# Patient Record
Sex: Male | Born: 1995 | Race: White | Hispanic: No | Marital: Married | Smoking: Never smoker
Health system: Southern US, Community
[De-identification: ages and names within clinical notes are randomized; demographics above are authoritative.]

## PROBLEM LIST (undated history)

## (undated) HISTORY — PX: TONSILLECTOMY AND ADENOIDECTOMY: SUR1326

## (undated) HISTORY — PX: KNEE ARTHROSCOPY WITH ANTERIOR CRUCIATE LIGAMENT (ACL) REPAIR: SHX5644

---
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
## 2013-02-01 ENCOUNTER — Emergency Department (HOSPITAL_COMMUNITY)
Admission: EM | Admit: 2013-02-01 | Discharge: 2013-02-02 | Disposition: A | Discharge status: Home / Self Care | Payer: Medicaid Other | Attending: Emergency Medicine | Admitting: Emergency Medicine

## 2013-02-01 ENCOUNTER — Encounter (HOSPITAL_COMMUNITY): Payer: Self-pay

## 2013-02-01 DIAGNOSIS — S060X1A Concussion with loss of consciousness of 30 minutes or less, initial encounter: Secondary | ICD-10-CM | POA: Insufficient documentation

## 2013-02-01 DIAGNOSIS — W1809XA Striking against other object with subsequent fall, initial encounter: Secondary | ICD-10-CM | POA: Insufficient documentation

## 2013-02-01 DIAGNOSIS — Y9239 Other specified sports and athletic area as the place of occurrence of the external cause: Secondary | ICD-10-CM | POA: Insufficient documentation

## 2013-02-01 DIAGNOSIS — Y9367 Activity, basketball: Secondary | ICD-10-CM | POA: Insufficient documentation

## 2013-02-01 NOTE — ED Notes (Signed)
Pt either collided with another player during basketball practice or hit the floor, noone witnessed the fall, patient states he doesn't remember anything that happened. Pt is very lethargic and complains of a severe headache.

## 2013-02-01 NOTE — ED Notes (Signed)
Pt also busted his lip when he fell, at the time they noticed blood around his nose but unknown if it came from his nose or lip

## 2013-02-02 ENCOUNTER — Emergency Department (HOSPITAL_COMMUNITY): Payer: Medicaid Other

## 2013-02-02 MED ORDER — HYDROCODONE-ACETAMINOPHEN 5-325 MG PO TABS: 1.0000 | ORAL_TABLET | ORAL | Status: DC | PRN

## 2013-02-02 MED ORDER — ONDANSETRON 8 MG PO TBDP
8.0000 mg | ORAL_TABLET | Freq: Once | ORAL | Status: AC
  Administered 2013-02-02: 8 mg via ORAL
  Filled 2013-02-02: qty 1

## 2013-02-02 MED ORDER — HYDROCODONE-ACETAMINOPHEN 5-325 MG PO TABS
1.0000 | ORAL_TABLET | Freq: Once | ORAL | Status: AC
  Administered 2013-02-02: 1 via ORAL
  Filled 2013-02-02: qty 1

## 2013-02-02 MED ORDER — IBUPROFEN 600 MG PO TABS: 600.0000 mg | ORAL_TABLET | Freq: Three times a day (TID) | ORAL | Status: DC | PRN

## 2013-02-02 NOTE — ED Provider Notes (Signed)
History     CSN: [id number]  Arrival date & time 02/01/13  2329   First MD Initiated Contact with Patient 02/02/13 0000      Chief Complaint  Patient presents with  . Head Injury    HPI Patient reports that time as well this evening and it sounds like he collided with another player.  He is unsure if he hit the floor hit the person's head.  He reports severe headache at this time.  Is not on anticoagulants.  No weakness of his upper lower extremities.  No neck pain.  He does have a small laceration to the buccal mucosa of his upper lip.  No trismus or malocclusion.  No dental injury.  His symptoms are mild to moderate in severity.  Nothing worsens or improves his symptoms.  No vomiting    History reviewed. No pertinent past medical history.  History reviewed. No pertinent past surgical history.  History reviewed. No pertinent family history.  History  Substance Use Topics  . Smoking status: Not on file  . Smokeless tobacco: Not on file  . Alcohol Use: No      Review of Systems  All other systems reviewed and are negative.    Allergies  Review of patient's allergies indicates no known allergies.  Home Medications  No current outpatient prescriptions on file.  BP 137/69  Pulse 84  Temp(Src) 98.4 F (36.9 C) (Oral)  Resp 20  Ht 5\' 10"  (1.778 m)  Wt 170 lb (77.111 kg)  BMI 24.39 kg/m2  SpO2 100%  Physical Exam  Nursing note and vitals reviewed. Constitutional: He is oriented to person, place, and time. He appears well-developed and well-nourished.  HENT:  Head: Normocephalic and atraumatic.  Small punctate laceration of the buccal mucosa of his upper lip on the left.  Dentition is normal.  No trismus or malocclusion.  Eyes: EOM are normal. Pupils are equal, round, and reactive to light.  Neck: Normal range of motion.  Cardiovascular: Normal rate, regular rhythm, normal heart sounds and intact distal pulses.   Pulmonary/Chest: Effort normal and breath sounds  normal. No respiratory distress.  Abdominal: Soft. He exhibits no distension. There is no tenderness.  Musculoskeletal: Normal range of motion.  Neurological: He is alert and oriented to person, place, and time.  5/5 strength in major muscle groups of  bilateral upper and lower extremities. Speech normal. No facial asymetry.   Skin: Skin is warm and dry.  Psychiatric: He has a normal mood and affect. Judgment normal.    ED Course  Procedures (including critical care time)  Labs Reviewed - No data to display Ct Head Wo Contrast  02/02/2013  *RADIOLOGY REPORT*  Clinical Data: Lethargy and severe headache after injury during basketball.  CT HEAD WITHOUT CONTRAST  Technique:  Contiguous axial images were obtained from the base of the skull through the vertex without contrast.  Comparison: None.  Findings: The ventricles and sulci are symmetrical without significant effacement, displacement, or dilatation. No mass effect or midline shift. No abnormal extra-axial fluid collections. The grey-white matter junction is distinct. Basal cisterns are not effaced. No acute intracranial hemorrhage. No depressed skull fractures.  Mild mucosal thickening in the maxillary antra and sphenoid sinus.  Small air-fluid level in the sphenoid sinus. Mastoid air cells are not opacified.  IMPRESSION: No acute intracranial abnormalities.  Probable inflammatory changes in the paranasal sinuses.  No depressed fractures identified.   Original Report Authenticated By: Burman Nieves, M.D.  1. Concussion, with loss of consciousness of 30 minutes or less, initial encounter       MDM  CT head is negative.  Likely concussion.  Patient and family given information regarding second impact syndrome.  Home with pain medication.  Understands return to the ER for new or worsening symptoms        Lyanne Co, MD 02/02/13 0040

## 2014-04-15 ENCOUNTER — Encounter (HOSPITAL_BASED_OUTPATIENT_CLINIC_OR_DEPARTMENT_OTHER): Payer: Self-pay

## 2014-04-15 ENCOUNTER — Emergency Department (HOSPITAL_BASED_OUTPATIENT_CLINIC_OR_DEPARTMENT_OTHER)
Admission: EM | Admit: 2014-04-15 | Discharge: 2014-04-15 | Disposition: A | Discharge status: Home / Self Care | Payer: BC Managed Care – PPO | Attending: Emergency Medicine | Admitting: Emergency Medicine

## 2014-04-15 ENCOUNTER — Emergency Department (HOSPITAL_BASED_OUTPATIENT_CLINIC_OR_DEPARTMENT_OTHER): Payer: BC Managed Care – PPO

## 2014-04-15 DIAGNOSIS — S8391XA Sprain of unspecified site of right knee, initial encounter: Secondary | ICD-10-CM | POA: Diagnosis present

## 2014-04-15 DIAGNOSIS — IMO0002 Reserved for concepts with insufficient information to code with codable children: Secondary | ICD-10-CM | POA: Insufficient documentation

## 2014-04-15 DIAGNOSIS — Y9367 Activity, basketball: Secondary | ICD-10-CM | POA: Insufficient documentation

## 2014-04-15 DIAGNOSIS — Y9239 Other specified sports and athletic area as the place of occurrence of the external cause: Secondary | ICD-10-CM | POA: Insufficient documentation

## 2014-04-15 DIAGNOSIS — Y92838 Other recreation area as the place of occurrence of the external cause: Secondary | ICD-10-CM

## 2014-04-15 DIAGNOSIS — X500XXA Overexertion from strenuous movement or load, initial encounter: Secondary | ICD-10-CM | POA: Insufficient documentation

## 2014-04-15 DIAGNOSIS — Z791 Long term (current) use of non-steroidal anti-inflammatories (NSAID): Secondary | ICD-10-CM | POA: Insufficient documentation

## 2014-04-15 MED ORDER — OXYCODONE-ACETAMINOPHEN 5-325 MG PO TABS
1.0000 | ORAL_TABLET | Freq: Once | ORAL | Status: AC
  Administered 2014-04-15: 1 via ORAL
  Filled 2014-04-15: qty 1

## 2014-04-15 MED ORDER — OXYCODONE-ACETAMINOPHEN 5-325 MG PO TABS: 1.0000 | ORAL_TABLET | Freq: Four times a day (QID) | ORAL | Status: DC | PRN

## 2014-04-15 NOTE — Discharge Instructions (Signed)
Combined Knee Ligament Sprain Combined knee ligament sprain is a tear of more than one of the major ligaments of the knee. The four knee ligaments are the anterior cruciate ligament (ACL), posterior cruciate ligament (PCL), medial collateral ligament (MCL) and lateral collateral ligament (LCL). Ligaments connect bones. They often cross a joint to hold the bones together. The ligaments of the knee keep the thigh bone (femur) and shinbone (tibia) in alignment. These ligaments allow the joint to move within a certain range of motion. Movement outside this range causes a ligament strain. Injury to multiple ligaments at the same time results in difficulty playing sports and in daily living. The most common multiple knee ligament injury involves the ACL and MCL. SYMPTOMS   A "popping" sound heard or felt at the time of injury.  Inability to continue activity after injury.  Inflammation of the knee within 6 hours after injury.  Possibly, deformity of the knee.  Inability to straighten the knee.  Feeling of the knee giving way or buckling.  Sometimes, locking of the knee, if the joint cartilage (meniscus) is injured.  Rarely, numbness, weakness, paralysis, discoloration, or coldness, due to nerve or blood vessel injury. CAUSES  Spraining of multiple ligaments occurs when a force is placed on the ligaments that exceeds their strength. This is often caused by a direct hit (trauma). It may also be caused by a non-contact injury (hyperextending the knee while twisting it).  RISK INCREASES WITH:  Contact sports (football, rugby, lacrosse). Sports that involve pivoting, jumping, cutting, or changing direction (basketball, gymnastics, soccer, volleyball). Sports on uneven ground (cross-country running, soccer).  Poor strength and/or flexibility.  Improper fitted or padded equipment. PREVENTION  Warm up and stretch properly before activity.  Maintain physical fitness:  Thigh, leg, and knee  flexibility.  Muscle strength and endurance.  Learn and use proper exercise technique.  Wear proper and well fitting equipment (correct length of cleats for surface). PROGNOSIS  Without treatment, the knee will continue to give way and become vulnerable to recurring injury. Recurring injury can happen during athletics or daily living. If the injury includes damage to a nerve or artery, the chance of a poor outcome increases. Surgery is often needed to regain stability of the knee. RELATED COMPLICATIONS  Frequently recurring symptoms, including:  Knee giving way.  Joint instability.  Inflammation.  Injury to the joint cartilage (meniscus). This may result in locking and/or swelling of the knee.  Injury to joint (articular) cartilage of the thigh bone or shinbone. This may result in arthritis of the knee.  Injury to other ligaments of the knee.  Knee stiffness (loss of knee motion).  Permanent injury to nerves (numbness, weakness, or paralysis) or arteries.  Removal (amputation) of the leg, due to nerve or artery injury. TREATMENT  Treatment first involves medicine and ice, to reduce pain and inflammation. Crutches may be advised, to decrease pain while walking. The knee may be restrained. Rehabilitation focuses on reducing swelling, regaining range of motion, and regaining muscle control and strength. It may also include receiving proper use training, wearing a brace, and education. (Avoid sports that involve pivoting, cutting, changing direction, jumping and landing). Surgery often offers the best chance for full recovery. Surgery from combined ACL/MCL injury involves replacement (reconstruction) of the ACL. This also allows for MCL healing. Despite surgery, some athletes may never return to their prior level of competition. The ability to return to sports depends on the related injuries and demands of the sport.  MEDICATION  If pain medicine is needed, nonsteroidal  anti-inflammatory medicines (aspirin and ibuprofen), or other minor pain relievers (acetaminophen), are often advised.  Do not take pain medicine for 7 days before surgery.  Stronger pain relievers may be prescribed. Use only as directed and only as much as you need.  Contact your caregiver immediately if any bleeding, stomach upset, or signs of an allergic reaction occur. COLD THERAPY  Cold treatment (icing) should be applied for 10 to 15 minutes every 2 to 3 hours for inflammation and pain, and immediately after activity that aggravates your symptoms. Use ice packs or an ice massage. SEEK MEDICAL CARE IF:   Symptoms get worse or do not improve in 6 weeks, despite treatment.  After injury or surgery, any of the following occur:  Pain, numbness, coldness, or a blue, gray, or dark color occurs in the foot or toenails.  Increased pain, swelling, redness, drainage of fluids, or bleeding in the affected area.  Signs of infection (headache, muscle aches, dizziness, or a general ill feeling with fever).  New, unexplained symptoms develop. (Drugs used in treatment may produce side effects.) Document Released: 10/19/2005 Document Revised: 01/11/2012 Document Reviewed: 01/31/2009 [facility] Patient Information 2014 [place], [place].

## 2014-04-15 NOTE — ED Provider Notes (Signed)
CSN: [id number]     Arrival date & time 04/15/14  2004 History  This chart was scribed for Junius ArgyleForrest S Kj Imbert, MD by Quintella ReichertMatthew Underwood, ED scribe.  This patient was seen in room MH05/MH05 and the patient's care was started at 9:58 PM.  Chief Complaint  Patient presents with  . Knee Injury     Patient is a 18 y.o. male presenting with leg pain. The history is provided by the patient. No language interpreter was used.  Leg Pain Location:  Knee Time since incident:  3 hours Injury: yes   Mechanism of injury comment:  Landed wrong while playing basketball. Knee location:  R knee Pain details:    Pain severity now: moderate to severe.   Onset quality:  Sudden   Duration:  3 hours   Timing:  Constant Chronicity:  New Worsened by:  Flexion Associated symptoms: numbness (numbness of right ankle)   Associated symptoms: no back pain, no fever and no neck pain    HPI Comments: Nathan Mckee is a 18 y.o. male who presents to the Emergency Department complaining of a knee injury sustained two and a half hours ago while attempting a layup playing basketball. Patient states that when he landed, his right knee "buckled back."  He states that he has trouble bending his knee and the the pain worsens with range of motion. He denies ankle pain, but states that his right ankle feels numb.  History reviewed. No pertinent past medical history. History reviewed. No pertinent past surgical history. History reviewed. No pertinent family history. History  Substance Use Topics  . Smoking status: Never Smoker   . Smokeless tobacco: Never Used  . Alcohol Use: No    Review of Systems  Constitutional: Negative for fever and chills.  HENT: Negative for congestion, rhinorrhea and sore throat.   Eyes: Negative for visual disturbance.  Respiratory: Negative for cough and shortness of breath.   Cardiovascular: Negative for chest pain and leg swelling.  Gastrointestinal: Negative for abdominal pain.   Genitourinary: Negative for dysuria.  Musculoskeletal: Positive for arthralgias (right knee pain and swelling). Negative for back pain and neck pain.  Skin: Negative for rash.  Neurological: Negative for headaches.  Hematological: Does not bruise/bleed easily.  Psychiatric/Behavioral: Negative for confusion.      Allergies  Review of patient's allergies indicates no known allergies.  Home Medications   Prior to Admission medications   Medication Sig Start Date End Date Taking? Authorizing Provider  HYDROcodone-acetaminophen (NORCO/VICODIN) 5-325 MG per tablet Take 1 tablet by mouth every 4 (four) hours as needed for pain. 02/02/13   Lyanne CoKevin M Campos, MD  ibuprofen (ADVIL,MOTRIN) 600 MG tablet Take 1 tablet (600 mg total) by mouth every 8 (eight) hours as needed for pain. 02/02/13   Lyanne CoKevin M Campos, MD   BP 137/86  Pulse 63  Temp(Src) 98.2 F (36.8 C) (Oral)  Resp 18  SpO2 99% Physical Exam  Nursing note and vitals reviewed. Constitutional: He is oriented to person, place, and time. He appears well-developed and well-nourished. No distress.  HENT:  Head: Normocephalic and atraumatic.  Mouth/Throat: Oropharynx is clear and moist. No oropharyngeal exudate.  Eyes: Conjunctivae and EOM are normal. Pupils are equal, round, and reactive to light.  Neck: Normal range of motion. Neck supple. No tracheal deviation present.  Cardiovascular: Normal rate, regular rhythm and normal heart sounds.  Exam reveals no gallop and no friction rub.   No murmur heard. Pulmonary/Chest: Effort normal and breath sounds normal. No  respiratory distress. He has no wheezes. He has no rales.  Abdominal: Soft. Bowel sounds are normal. He exhibits no distension and no mass. There is no tenderness. There is no rebound and no guarding.  Musculoskeletal: Normal range of motion.  Mild diffuse swelling of the right knee. Diffuse moderate tenderness to palpation of the knee.  Moderate decreased ROM due to pain of the  right knee.   Normal capillary refill in distal lower extremities.  2+ distal pulses in distal lower extremities.  Neurological: He is alert and oriented to person, place, and time.  Skin: Skin is warm and dry.  Psychiatric: He has a normal mood and affect. His behavior is normal.    ED Course  Procedures (including critical care time) DIAGNOSTIC STUDIES: Oxygen Saturation is 99% on room air, normal by my interpretation.    COORDINATION OF CARE: 10:10 PM-Informed patient that x-ray was negative for fracture. Discussed treatment plan which includes Percocet, RICE treatment, and referral to follow up with sports medicine if needed with pt at bedside and pt agreed to plan.     Labs Review Labs Reviewed - No data to display  Imaging Review Dg Knee Complete 4 Views Right  04/15/2014   CLINICAL DATA:  Status post fall.  Right knee pain.  EXAM: RIGHT KNEE - COMPLETE 4+ VIEW  COMPARISON:  None.  FINDINGS: There is no evidence of fracture or dislocation. The joint spaces are preserved. No significant degenerative change is seen; the patellofemoral joint is grossly unremarkable in appearance.  Trace joint fluid remains within normal limits. The visualized soft tissues are normal in appearance.  IMPRESSION: No evidence of fracture or dislocation.   Electronically Signed   By: Roanna RaiderJeffery  Chang M.D.   On: 04/15/2014 21:19     EKG Interpretation None      MDM   Final diagnoses:  Right knee sprain    3:23 PM 18 y.o. male here w/ knee after injuring it performing a lay up in basketball. Denies hitting head or loc. Mild effusion w/ dec rom d/t pain. Plain film neg. Pt will not tolerate a more detailed knee exam d/t pain. Will give percocet, knee immobilizer, crutches, recommend RICE.     I have discussed the diagnosis/risks/treatment options with the patient and family and believe the pt to be eligible for discharge home to follow-up with sports medicine in 1 week if no better. We also  discussed returning to the ED immediately if new or worsening sx occur. We discussed the sx which are most concerning (e.g., worsening pain) that necessitate immediate return. Medications administered to the patient during their visit and any new prescriptions provided to the patient are listed below.  Medications given during this visit Medications  oxyCODONE-acetaminophen (PERCOCET/ROXICET) 5-325 MG per tablet 1 tablet (1 tablet Oral Given 04/15/14 2216)    Discharge Medication List as of 04/15/2014 10:11 PM    START taking these medications   Details  oxyCODONE-acetaminophen (PERCOCET) 5-325 MG per tablet Take 1 tablet by mouth every 6 (six) hours as needed., Starting 04/15/2014, Until Discontinued, Print          I personally performed the services described in this documentation, which was scribed in my presence. The recorded information has been reviewed and is accurate.    Junius ArgyleForrest S Micajah Dennin, MD 04/16/14 [phone]

## 2014-04-15 NOTE — ED Notes (Signed)
Pt reports having knee hyper extended.  Sts severe pain while playing basketball.  Pt can't have anything touch knee without pain.

## 2014-04-15 NOTE — ED Notes (Signed)
MD at bedside. 

## 2014-04-15 NOTE — ED Notes (Signed)
Pt reports knee pain while playing slipped on water on floor causing knee pain

## 2014-05-11 ENCOUNTER — Ambulatory Visit: Payer: BC Managed Care – PPO | Admitting: Family Medicine

## 2014-05-30 ENCOUNTER — Encounter: Payer: Self-pay | Admitting: Family Medicine

## 2014-05-30 ENCOUNTER — Ambulatory Visit (INDEPENDENT_AMBULATORY_CARE_PROVIDER_SITE_OTHER): Payer: BC Managed Care – PPO | Admitting: Family Medicine

## 2014-05-30 VITALS — BP 143/81 | HR 62 | Ht 71.0 in | Wt 200.0 lb

## 2014-05-30 DIAGNOSIS — S99919A Unspecified injury of unspecified ankle, initial encounter: Secondary | ICD-10-CM

## 2014-05-30 DIAGNOSIS — S83511A Sprain of anterior cruciate ligament of right knee, initial encounter: Secondary | ICD-10-CM

## 2014-05-30 DIAGNOSIS — S8990XA Unspecified injury of unspecified lower leg, initial encounter: Secondary | ICD-10-CM

## 2014-05-30 DIAGNOSIS — S83509A Sprain of unspecified cruciate ligament of unspecified knee, initial encounter: Secondary | ICD-10-CM

## 2014-05-30 DIAGNOSIS — S8991XA Unspecified injury of right lower leg, initial encounter: Secondary | ICD-10-CM

## 2014-05-30 DIAGNOSIS — S99929A Unspecified injury of unspecified foot, initial encounter: Secondary | ICD-10-CM

## 2014-05-31 ENCOUNTER — Encounter: Payer: Self-pay | Admitting: Family Medicine

## 2014-05-31 DIAGNOSIS — S8991XA Unspecified injury of right lower leg, initial encounter: Secondary | ICD-10-CM | POA: Insufficient documentation

## 2014-05-31 NOTE — Assessment & Plan Note (Signed)
concerning for ACL tear, contusions of knee.  Radiographs negative.  Obvious effusion.  Will go ahead with MRI to further assess and likely orthopedic surgeon referral if this is confirmed.  In meantime icing, nsaids, elevation, avoid cutting sports.

## 2014-05-31 NOTE — Progress Notes (Addendum)
Patient ID: Nathan ConceptionMichael Mckee, male   DOB: 1996/09/30, 18 y.o.   MRN: [id number]  PCP: No PCP Per Patient  Subjective:   HPI: Patient is a 18 y.o. male here for right knee injury.  Patient reports 6 weeks ago he was playing basketball when he came down from a layup and hyperextended his right knee. + swelling and bruising. Feels like it pops out of place now. Sometimes pain is severe. Feels unstable. Radiographs negative in ED. No prior issues with this knee. No catching or locking.  History reviewed. No pertinent past medical history.  Current Outpatient Prescriptions on File Prior to Visit  Medication Sig Dispense Refill  . oxyCODONE-acetaminophen (PERCOCET) 5-325 MG per tablet Take 1 tablet by mouth every 6 (six) hours as needed.  15 tablet  0   No current facility-administered medications on file prior to visit.    Past Surgical History  Procedure Laterality Date  . Tonsillectomy and adenoidectomy      No Known Allergies  History   Social History  . Marital Status: Single    Spouse Name: N/A    Number of Children: N/A  . Years of Education: N/A   Occupational History  . Not on file.   Social History Main Topics  . Smoking status: Never Smoker   . Smokeless tobacco: Never Used  . Alcohol Use: No  . Drug Use: No  . Sexual Activity: Not on file   Other Topics Concern  . Not on file   Social History Narrative  . No narrative on file    No family history on file.  BP 143/81  Pulse 62  Ht 5\' 11"  (1.803 m)  Wt 200 lb (90.719 kg)  BMI 27.91 kg/m2  Review of Systems: See HPI above.    Objective:  Physical Exam:  Gen: NAD  Right knee: Mod effusion.  No bruising, other deformity. TTP medial and lateral joint lines mildly. FROM. 1+ ant drawer and lachmanns compared to left knee.  Negative post drawer. Negative valgus/varus testing.  Negative mcmurrays, apleys, patellar apprehension. NV intact distally.    Assessment & Plan:  1. Right knee  injury - concerning for ACL tear, contusions of knee.  Radiographs negative.  Obvious effusion.  Will go ahead with MRI to further assess and likely orthopedic surgeon referral if this is confirmed.  In meantime icing, nsaids, elevation, avoid cutting sports.  Addendum:  MRI reviewed and discussed with patient.  He does have complete ACL tear but also with medial and lateral meniscus tears.  While it shows MCL sprain, clinically no pain or laxity with testing of MCL.  Over 6 weeks out from the initial injury - already beyond the 4-6 weeks of crutches, non to minimal weight bearing used to treat the femoral condyle impaction fracture.  Will refer to orthopedics to discuss ACL reconstruction, meniscal debridement.

## 2014-06-02 ENCOUNTER — Ambulatory Visit (HOSPITAL_BASED_OUTPATIENT_CLINIC_OR_DEPARTMENT_OTHER)
Admission: RE | Admit: 2014-06-02 | Discharge: 2014-06-02 | Disposition: A | Discharge status: Home / Self Care | Payer: BC Managed Care – PPO | Source: Ambulatory Visit | Attending: Family Medicine | Admitting: Family Medicine

## 2014-06-02 DIAGNOSIS — Y999 Unspecified external cause status: Secondary | ICD-10-CM | POA: Insufficient documentation

## 2014-06-02 DIAGNOSIS — S83511A Sprain of anterior cruciate ligament of right knee, initial encounter: Secondary | ICD-10-CM

## 2014-06-02 DIAGNOSIS — Y929 Unspecified place or not applicable: Secondary | ICD-10-CM | POA: Insufficient documentation

## 2014-06-02 DIAGNOSIS — X58XXXA Exposure to other specified factors, initial encounter: Secondary | ICD-10-CM | POA: Insufficient documentation

## 2014-06-02 DIAGNOSIS — S83509A Sprain of unspecified cruciate ligament of unspecified knee, initial encounter: Secondary | ICD-10-CM | POA: Insufficient documentation

## 2014-06-04 ENCOUNTER — Other Ambulatory Visit: Payer: Self-pay | Admitting: *Deleted

## 2014-06-04 DIAGNOSIS — S83511A Sprain of anterior cruciate ligament of right knee, initial encounter: Secondary | ICD-10-CM

## 2016-05-12 ENCOUNTER — Ambulatory Visit: Payer: Self-pay | Admitting: Family Medicine

## 2016-05-26 ENCOUNTER — Ambulatory Visit: Payer: Self-pay | Admitting: Family Medicine

## 2016-06-02 ENCOUNTER — Ambulatory Visit: Payer: Self-pay | Admitting: Family Medicine

## 2019-10-19 ENCOUNTER — Telehealth: Payer: Self-pay

## 2019-10-19 NOTE — Telephone Encounter (Signed)
Nathan Mckee from [facility] called to get additional phone number for pt. Gave number on file.  [place]

## 2019-11-27 ENCOUNTER — Other Ambulatory Visit: Payer: Self-pay | Admitting: Nurse Practitioner

## 2019-11-27 ENCOUNTER — Ambulatory Visit
Admission: RE | Admit: 2019-11-27 | Discharge: 2019-11-27 | Disposition: A | Discharge status: Home / Self Care | Payer: No Typology Code available for payment source | Source: Ambulatory Visit | Attending: Nurse Practitioner | Admitting: Nurse Practitioner

## 2019-11-27 DIAGNOSIS — Z021 Encounter for pre-employment examination: Secondary | ICD-10-CM

## 2020-09-20 ENCOUNTER — Other Ambulatory Visit: Payer: Self-pay

## 2020-09-20 ENCOUNTER — Emergency Department (HOSPITAL_COMMUNITY): Payer: 59

## 2020-09-20 ENCOUNTER — Encounter (HOSPITAL_COMMUNITY): Payer: Self-pay

## 2020-09-20 ENCOUNTER — Emergency Department (HOSPITAL_COMMUNITY)
Admission: EM | Admit: 2020-09-20 | Discharge: 2020-09-20 | Disposition: A | Discharge status: Home / Self Care | Payer: 59 | Attending: Emergency Medicine | Admitting: Emergency Medicine

## 2020-09-20 DIAGNOSIS — K659 Peritonitis, unspecified: Secondary | ICD-10-CM | POA: Insufficient documentation

## 2020-09-20 DIAGNOSIS — R1032 Left lower quadrant pain: Secondary | ICD-10-CM | POA: Diagnosis present

## 2020-09-20 DIAGNOSIS — K6389 Other specified diseases of intestine: Secondary | ICD-10-CM

## 2020-09-20 LAB — CBC
HCT: 46.3 % (ref 39.0–52.0)
Hemoglobin: 16.1 g/dL (ref 13.0–17.0)
MCH: 31.3 pg (ref 26.0–34.0)
MCHC: 34.8 g/dL (ref 30.0–36.0)
MCV: 90.1 fL (ref 80.0–100.0)
Platelets: 287 10*3/uL (ref 150–400)
RBC: 5.14 MIL/uL (ref 4.22–5.81)
RDW: 11.7 % (ref 11.5–15.5)
WBC: 9.1 10*3/uL (ref 4.0–10.5)
nRBC: 0 % (ref 0.0–0.2)

## 2020-09-20 LAB — URINALYSIS, ROUTINE W REFLEX MICROSCOPIC
Bacteria, UA: NONE SEEN
Bilirubin Urine: NEGATIVE
Glucose, UA: NEGATIVE mg/dL
Hgb urine dipstick: NEGATIVE
Ketones, ur: NEGATIVE mg/dL
Nitrite: NEGATIVE
Protein, ur: NEGATIVE mg/dL
Specific Gravity, Urine: 1.018 (ref 1.005–1.030)
pH: 7 (ref 5.0–8.0)

## 2020-09-20 LAB — COMPREHENSIVE METABOLIC PANEL
ALT: 113 U/L — ABNORMAL HIGH (ref 0–44)
AST: 82 U/L — ABNORMAL HIGH (ref 15–41)
Albumin: 4.5 g/dL (ref 3.5–5.0)
Alkaline Phosphatase: 63 U/L (ref 38–126)
Anion gap: 10 (ref 5–15)
BUN: 10 mg/dL (ref 6–20)
CO2: 24 mmol/L (ref 22–32)
Calcium: 9.3 mg/dL (ref 8.9–10.3)
Chloride: 108 mmol/L (ref 98–111)
Creatinine, Ser: 0.79 mg/dL (ref 0.61–1.24)
GFR, Estimated: >60 mL/min (ref >60)
Glucose, Bld: 89 mg/dL (ref 70–99)
Potassium: 3.8 mmol/L (ref 3.5–5.1)
Sodium: 142 mmol/L (ref 135–145)
Total Bilirubin: 0.8 mg/dL (ref 0.3–1.2)
Total Protein: 7.3 g/dL (ref 6.5–8.1)

## 2020-09-20 LAB — LIPASE, BLOOD: Lipase: 35 U/L (ref 11–51)

## 2020-09-20 MED ORDER — SODIUM CHLORIDE (PF) 0.9 % IJ SOLN
INTRAMUSCULAR | Status: AC
  Filled 2020-09-20: qty 50

## 2020-09-20 MED ORDER — IOHEXOL 300 MG/ML  SOLN
100.0000 mL | Freq: Once | INTRAMUSCULAR | Status: AC | PRN
  Administered 2020-09-20: 100 mL via INTRAVENOUS

## 2020-09-20 MED ORDER — KETOROLAC TROMETHAMINE 30 MG/ML IJ SOLN
30.0000 mg | Freq: Once | INTRAMUSCULAR | Status: AC
  Administered 2020-09-20: 30 mg via INTRAVENOUS
  Filled 2020-09-20: qty 1

## 2020-09-20 NOTE — ED Triage Notes (Signed)
patient c/o intermittent left lower abdominal pain x 1 week. Patient denies any N/v/D.

## 2020-09-20 NOTE — Discharge Instructions (Addendum)
Please treat your symptoms with over-the-counter medications as needed. This should resolve on it's own. Return to the ED if you develop fever, severely worsening abdominal pain, significant diarrhea.

## 2020-09-20 NOTE — ED Provider Notes (Signed)
[facility]-EMERGENCY DEPT Provider Note   CSN: [id number] Arrival date & time: 09/20/20  1452     History Chief Complaint  Patient presents with  . Abdominal Pain    Nathan Mckee is a 24 y.o. male presenting to the emergency department with 1 week of intermittent left lower abdominal pain that is worsening today.  Patient states pain is coming and going, sometimes worse with movement and certain positions.  Today his pain became so severe he could not sit upright.  He has never had similar symptoms in the past.  He has no other accompanying symptoms including no diarrhea, constipation, nausea, vomiting, fevers, urinary symptoms, testicular pain.  No alleviating factors.  No history of abdominal surgeries.  The history is provided by the patient.       History reviewed. No pertinent past medical history.  Patient Active Problem List   Diagnosis Date Noted  . Right knee injury 05/31/2014  . Right knee sprain 04/15/2014    Past Surgical History:  Procedure Laterality Date  . KNEE ARTHROSCOPY WITH ANTERIOR CRUCIATE LIGAMENT (ACL) REPAIR Right   . TONSILLECTOMY AND ADENOIDECTOMY         Family History  Problem Relation Age of Onset  . Hypertension Father     Social History   Tobacco Use  . Smoking status: Never Smoker  . Smokeless tobacco: Never Used  Vaping Use  . Vaping Use: Every day  . Substances: Nicotine, Flavoring  Substance Use Topics  . Alcohol use: No  . Drug use: No    Home Medications Prior to Admission medications   Medication Sig Start Date End Date Taking? Authorizing Provider  oxyCODONE-acetaminophen (PERCOCET) 5-325 MG per tablet Take 1 tablet by mouth every 6 (six) hours as needed. 04/15/14   Purvis Sheffield, MD    Allergies    Patient has no known allergies.  Review of Systems   Review of Systems  Constitutional: Negative for fever.  Gastrointestinal: Positive for abdominal pain. Negative for constipation,  diarrhea, nausea and vomiting.  Genitourinary: Negative for dysuria, flank pain, frequency, hematuria and testicular pain.  All other systems reviewed and are negative.   Physical Exam Updated Vital Signs BP 137/89   Pulse 67   Temp 97.9 F (36.6 C) (Oral)   Resp 16   Ht 5\' 11"  (1.803 m)   Wt 88.5 kg   SpO2 100%   BMI 27.20 kg/m   Physical Exam Vitals and nursing note reviewed.  Constitutional:      General: He is not in acute distress.    Appearance: He is well-developed. He is not ill-appearing.  HENT:     Head: Normocephalic and atraumatic.  Eyes:     Conjunctiva/sclera: Conjunctivae normal.  Cardiovascular:     Rate and Rhythm: Normal rate and regular rhythm.  Pulmonary:     Effort: Pulmonary effort is normal. No respiratory distress.     Breath sounds: Normal breath sounds.  Abdominal:     General: Abdomen is flat. Bowel sounds are normal.     Palpations: Abdomen is soft.     Tenderness: There is abdominal tenderness in the left lower quadrant. There is no right CVA tenderness, left CVA tenderness or guarding.     Hernia: No hernia is present.  Skin:    General: Skin is warm.  Neurological:     Mental Status: He is alert.  Psychiatric:        Behavior: Behavior normal.     ED Results /  Procedures / Treatments   Labs (all labs ordered are listed, but only abnormal results are displayed) Labs Reviewed  COMPREHENSIVE METABOLIC PANEL - Abnormal; Notable for the following components:      Result Value   AST 82 (*)    ALT 113 (*)    All other components within normal limits  URINALYSIS, ROUTINE W REFLEX MICROSCOPIC - Abnormal; Notable for the following components:   Leukocytes,Ua TRACE (*)    All other components within normal limits  LIPASE, BLOOD  CBC    EKG None  Radiology CT Abdomen Pelvis W Contrast  Result Date: 09/20/2020 CLINICAL DATA:  Intermittent left lower abdominal pain for 1 week EXAM: CT ABDOMEN AND PELVIS WITH CONTRAST TECHNIQUE:  Multidetector CT imaging of the abdomen and pelvis was performed using the standard protocol following bolus administration of intravenous contrast. CONTRAST:  OMNIPAQUE IOHEXOL 300 MG/ML  SOLN COMPARISON:  None. FINDINGS: Lower chest: Lung bases are clear. Normal heart size. No pericardial effusion. Hepatobiliary: No worrisome focal liver lesions. Smooth liver surface contour. Normal hepatic attenuation. Normal gallbladder and biliary tree. Pancreas: No pancreatic ductal dilatation or surrounding inflammatory changes. Spleen: Normal in size. No concerning splenic lesions. Adrenals/Urinary Tract: Normal adrenal glands. Kidneys are normally located with symmetric enhancement. No suspicious renal lesion, urolithiasis or hydronephrosis. Urinary bladder is largely decompressed at the time of exam and therefore poorly evaluated by CT imaging. No gross bladder abnormality. Stomach/Bowel: Distal esophagus, stomach and duodenal sweep are unremarkable. No small bowel wall thickening or dilatation. No proximal colonic thickening or dilatation. Minimal thickening of the descending colon with some adjacent surrounding phlegmonous change which appears centered upon a circumscribed elongated focus of fat, likely epiploic appendage with some adjacent reactive thickening of the peritoneum and trace fluid in the left pericolic gutter. More distal colon is unremarkable. Vascular/Lymphatic: No significant vascular findings are present. No enlarged abdominal or pelvic lymph nodes. Reproductive: The prostate and seminal vesicles are unremarkable. Other: Inflammatory changes centered in the left abdomen adjacent the descending colon possibly upon an inflamed epiploic appendage, as above. Trace reactive free fluid in the left pericolic gutter and deep pelvis. No free air. No bowel containing hernia. Musculoskeletal: Benign bone island in the left ischial. No concerning osseous lesions. Musculature is normal and symmetric.  IMPRESSION: Inflammation and phlegmonous centered upon a circumscribed elongated focus of fat, likely epiploic appendage of the descending colon with some adjacent likely reactive thickening of the descending colon, mild peritoneal thickening, and trace fluid in the left pericolic gutter. Findings are favored to reflect an epiploic appendagitis rather than primary colonic process. Electronically Signed   By: Kreg Shropshire M.D.   On: 09/20/2020 19:26    Procedures Procedures (including critical care time)  Medications Ordered in ED Medications  iohexol (OMNIPAQUE) 300 MG/ML solution 100 mL (100 mLs Intravenous Contrast Given 09/20/20 1839)  sodium chloride (PF) 0.9 % injection (  Given 09/20/20 1900)  ketorolac (TORADOL) 30 MG/ML injection 30 mg (30 mg Intravenous Given 09/20/20 2011)    ED Course  I have reviewed the triage vital signs and the nursing notes.  Pertinent labs & imaging results that were available during my care of the patient were reviewed by me and considered in my medical decision making (see chart for details).    MDM Rules/Calculators/A&P                          Patient presenting for 1 week of intermittent left  lower quadrant abdominal pain, worsening today.  No fever, nausea, vomiting, diarrhea or constipation.  No urinary symptoms.  On exam, he is well-appearing and in no distress.  He does have focal tenderness to the left lower quadrant.  Labs with white count of 9.1, normal lipase, metabolic panel with minimally elevated LFTs, UA is negative.  Considering patient's focal abdominal pain and tenderness on exam, with worsening symptoms today, CT scan ordered.    CT scan obtained and is consistent with epiploic appendagitis.  Less likely colitis, no other symptoms to suggest this as well. Discussed diagnosis with patient and plan for supportive measures including NSAIDs.  Discussed reasons to return to the ED which include severely worsening pain, high fever, profuse  diarrhea.  Outpatient follow-up.  Patient is in no distress, appropriate for discharge.  Discussed results, findings, treatment and follow up. Patient advised of return precautions. Patient verbalized understanding and agreed with plan.  Final Clinical Impression(s) / ED Diagnoses Final diagnoses:  Epiploic appendagitis    Rx / DC Orders ED Discharge Orders    None       Jenesa Foresta, [place] N, PA-C 09/20/20 2046    Derwood Kaplan, MD 09/21/20 1349

## 2021-05-23 IMAGING — CR DG CHEST 1V
1 series · 1 of 1 positions shown · non-contrast
Comparison: None.

CLINICAL DATA: Pre-employment physical examination

EXAM:
CHEST  1 VIEW

[w chest pa]
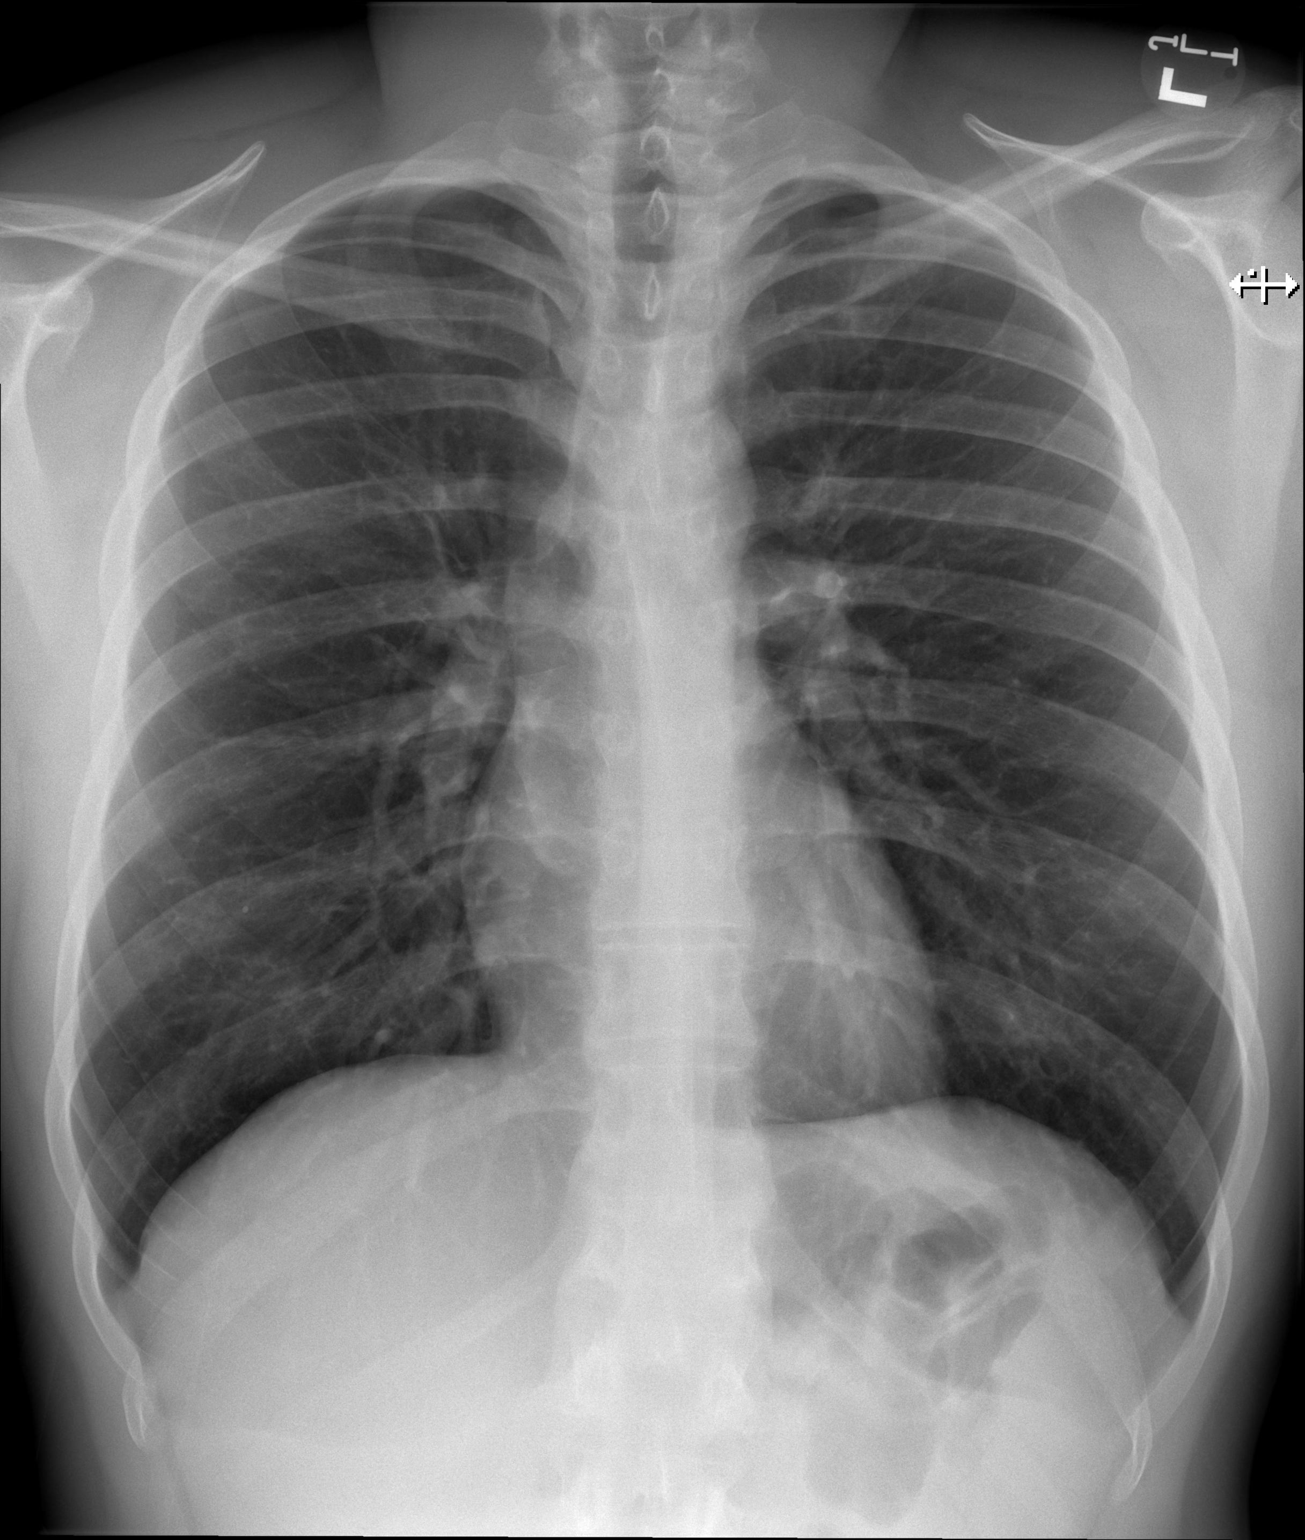

[1 of 1 positions shown; findings below may reference images not displayed]

FINDINGS: Lungs are clear. Heart size and pulmonary vascularity are normal. No
adenopathy. No bone lesions.
IMPRESSION: No abnormality noted.

## 2022-03-09 NOTE — Progress Notes (Addendum)
?Cardiology Office Note:   ? ?Date:  03/10/2022  ? ?ID:  Nathan Mckee, DOB 04-Jun-1996, MRN [id number] ? ?PCP:  Corrington, Meredith Mody, MD  ? ?[facility] Providers ?Cardiologist:  Alverda Skeans, MD ?Referring MD: Vivien Presto, MD  ? ?Chief Complaint/Reason for Referral: Palpitations and chest pain ? ?ASSESSMENT:   ? ?1. Palpitations   ?2. Precordial pain   ? ? ?PLAN:   ? ?In order of problems listed above: ? Palpitations:  Will obtain echocardiogram, monitor, and reflex TSH.  Will keep follow up with me open-ended depending on these results. ?Chest pain: We will obtain an exercise treadmill stress test to evaluate further.  If his evaluation is negative then I advised him to obtain a PPI from his primary care provider to see if that helps. ? ? ?     ? ?Shared Decision Making/Informed Consent ?The risks [chest pain, shortness of breath, cardiac arrhythmias, dizziness, blood pressure fluctuations, myocardial infarction, stroke/transient ischemic attack, and life-threatening complications (estimated to be 1 in 10,000)], benefits (risk stratification, diagnosing coronary artery disease, treatment guidance) and alternatives of an exercise tolerance test were discussed in detail with [name] and he agrees to proceed.  ? ?Dispo:  Return if symptoms worsen or fail to improve.  ? ?  ? ?Medication Adjustments/Labs and Tests Ordered: ?Current medicines are reviewed at length with the patient today.  Concerns regarding medicines are outlined above. ? ?The following changes have been made:  no change  ? ?Labs/tests ordered: ?Orders Placed This Encounter  ?Procedures  ? TSH Rfx on Abnormal to Free T4  ? LONG TERM MONITOR (3-14 DAYS)  ? Exercise Tolerance Test  ? EKG 12-Lead  ? ECHOCARDIOGRAM COMPLETE  ? ? ?Medication Changes: ?No orders of the defined types were placed in this encounter. ? ? ? ?Current medicines are reviewed at length with the patient today.  The patient does not have concerns regarding  medicines. ? ? ?History of Present Illness:   ? ?FOCUSED PROBLEM LIST:   ? Currently vaping ? ?The patient is a 26 y.o. male with the indicated medical history here for palpitations.  The patient is a full-time Emergency planning/management officer.  He tells me he notices palpitations on a daily basis and they happen more than once.  He will notice that his heart seems to skip a beat and then stop.  His palpitations are relatively short-lived but again happen frequently.  They are bothersome and not associated with chest pain but are associated with some lightheadedness and shortness of breath.  He also gets feeling in his neck palpitations and he sees pulsations in his neck as well.  These are separate from the chest palpitations that he describes.  These happen less frequently and can last much longer.  In terms of other symptoms he denies any exertional dyspnea, presyncope, syncope, severe bleeding or bruising, or peripheral edema.  He does admit to a burning chest pain at times that is not related to exertion. ?   ?  ?Previous Medical History: ?History reviewed. No pertinent past medical history. ? ? ?Current Medications: ?No outpatient medications have been marked as taking for the 03/10/22 encounter (Office Visit) with [name], MD.  ?  ? ?Allergies:    ?Patient has no known allergies.  ? ?Social History:   ?Social History  ? ?Tobacco Use  ? Smoking status: Never  ? Smokeless tobacco: Never  ?Vaping Use  ? Vaping Use: Every day  ? Substances: Nicotine, Flavoring  ?Substance Use  Topics  ? Alcohol use: No  ? Drug use: No  ?  ? ?Family Hx: ?Family History  ?Problem Relation Age of Onset  ? Hypertension Father   ?  ? ?Review of Systems:   ?Please see the history of present illness.    ?All other systems reviewed and are negative. ?  ? ? ?EKGs/Labs/Other Test Reviewed:   ? ?EKG:  EKG performed today that I personally reviewed demonstrates normal sinus rhythm with T wave inversions inferiorly. ? ?Prior CV studies: ? ?None  available ? ?Other studies Reviewed: ?Review of the additional studies/records demonstrates: CT abdomen pelvis 2021 with no evidence of aortic atherosclerosis or aneurysm ? ?Recent Labs: ?No results found for requested labs within last 8760 hours.  ? ?Recent Lipid Panel ?No results found for: CHOL, TRIG, HDL, LDLCALC, LDLDIRECT ? ?Risk Assessment/Calculations:   ? ?  ?    ? ?Physical Exam:   ? ?VS:  BP (!) 120/92   Pulse 66   Ht 5\' 10"  (1.778 m)   Wt 216 lb (98 kg)   SpO2 99%   BMI 30.99 kg/m?    ?Wt Readings from Last 3 Encounters:  ?03/10/22 216 lb (98 kg)  ?09/20/20 195 lb (88.5 kg)  ?05/30/14 200 lb (90.7 kg) (92 %, Z= 1.41)*  ? ?* Growth percentiles are based on CDC (Boys, 2-20 Years) data.  ?  ?GENERAL:  No apparent distress, AOx3 ?HEENT:  No carotid bruits, +2 carotid impulses, no scleral icterus ?CAR: RRR no murmurs, gallops, rubs, or thrills ?RES:  Clear to auscultation bilaterally ?ABD:  Soft, nontender, nondistended, positive bowel sounds x 4 ?VASC:  +2 radial pulses, +2 carotid pulses, palpable pedal pulses ?NEURO:  CN 2-12 grossly intact; motor and sensory grossly intact ?PSYCH:  No active depression or anxiety ?EXT:  No edema, ecchymosis, or cyanosis ? ?Signed, ?Early Osmond, MD  ?03/10/2022 9:19 AM    ?[place] ?[address], [place], [place]  [address] ?Phone: [phone]; Fax: [phone]  ? ?Note:  This document was prepared using Dragon voice recognition software and may include unintentional dictation errors. ?

## 2022-03-10 ENCOUNTER — Encounter: Payer: Self-pay | Admitting: *Deleted

## 2022-03-10 ENCOUNTER — Encounter: Payer: Self-pay | Admitting: Internal Medicine

## 2022-03-10 ENCOUNTER — Ambulatory Visit (INDEPENDENT_AMBULATORY_CARE_PROVIDER_SITE_OTHER): Payer: Commercial Managed Care - PPO

## 2022-03-10 ENCOUNTER — Ambulatory Visit (INDEPENDENT_AMBULATORY_CARE_PROVIDER_SITE_OTHER): Payer: Commercial Managed Care - PPO | Admitting: Internal Medicine

## 2022-03-10 VITALS — BP 120/92 | HR 66 | Ht 70.0 in | Wt 216.0 lb

## 2022-03-10 DIAGNOSIS — R002 Palpitations: Secondary | ICD-10-CM

## 2022-03-10 DIAGNOSIS — R072 Precordial pain: Secondary | ICD-10-CM | POA: Diagnosis not present

## 2022-03-10 LAB — TSH RFX ON ABNORMAL TO FREE T4: TSH: 3.31 u[IU]/mL (ref 0.450–4.500)

## 2022-03-10 NOTE — Patient Instructions (Addendum)
Medication Instructions:  ?No changes ?*If you need a refill on your cardiac medications before your next appointment, please call your pharmacy* ? ? ?Lab Work: ?Today: TSH reflex free T4 if abnormal ? ? ?Testing/Procedures: ?Your physician has requested that you have an echocardiogram. Echocardiography is a painless test that uses sound waves to create images of your heart. It provides your doctor with information about the size and shape of your heart and how well your heart?s chambers and valves are working. This procedure takes approximately one hour. There are no restrictions for this procedure. ? ?Your physician has requested that you have an exercise tolerance test. For further information please visit [email or web address] Please also follow instruction sheet, as given. ? ?Zio Heart Monitor Patch - 3 days ? ? ?Follow-Up: ?As needed based on results ? ?ZIO XT- Long Term Monitor Instructions ? ?Your physician has requested you wear a ZIO patch monitor for 3 days.  ?This is a single patch monitor. [employer] supplies one patch monitor per enrollment. Additional ?stickers are not available. Please do not apply patch if you will be having a Nuclear Stress Test,  ?Echocardiogram, Cardiac CT, MRI, or Chest Xray during the period you would be wearing the  ?monitor. The patch cannot be worn during these tests. You cannot remove and re-apply the  ?ZIO XT patch monitor.  ?Your ZIO patch monitor will be mailed 3 day USPS to your address on file. It may take 3-5 days  ?to receive your monitor after you have been enrolled.  ?Once you have received your monitor, please review the enclosed instructions. Your monitor  ?has already been registered assigning a specific monitor serial # to you. ? ?Billing and Patient Assistance Program Information ? ?We have supplied [employer] with any of your insurance information on file for billing purposes. ?[employer] offers a sliding scale Patient Assistance Program for patients that do not have   ?insurance, or whose insurance does not completely cover the cost of the ZIO monitor.  ?You must apply for the Patient Assistance Program to qualify for this discounted rate.  ?To apply, please call [employer] at [phone], select option 4, select option 2, ask to apply for  ?Patient Assistance Program. Meredeth Ide will ask your household income, and how many people  ?are in your household. They will quote your out-of-pocket cost based on that information.  ?[employer] will also be able to set up a 28-month, interest-free payment plan if needed. ? ?Applying the monitor ?  ?Shave hair from upper left chest.  ?Hold abrader disc by orange tab. Rub abrader in 40 strokes over the upper left chest as  ?indicated in your monitor instructions.  ?Clean area with 4 enclosed alcohol pads. Let dry.  ?Apply patch as indicated in monitor instructions. Patch will be placed under collarbone on left  ?side of chest with arrow pointing upward.  ?Rub patch adhesive wings for 2 minutes. Remove white label marked "1". Remove the white  ?label marked "2". Rub patch adhesive wings for 2 additional minutes.  ?While looking in a mirror, press and release button in center of patch. A small green light will  ?flash 3-4 times. This will be your only indicator that the monitor has been turned on.  ?Do not shower for the first 24 hours. You may shower after the first 24 hours.  ?Press the button if you feel a symptom. You will hear a small click. Record Date, Time and  ?Symptom in the Patient Logbook.  ?When you are ready to  remove the patch, follow instructions on the last 2 pages of Patient  ?Logbook. Stick patch monitor onto the last page of Patient Logbook.  ?Place Patient Logbook in the blue and white box. Use locking tab on box and tape box closed  ?securely. The blue and white box has prepaid postage on it. Please place it in the mailbox as  ?soon as possible. Your physician should have your test results approximately 7 days after the  ?monitor  has been mailed back to [facility].  ?Call [facility] at [phone] if you have questions regarding  ?your ZIO XT patch monitor. Call them immediately if you see an orange light blinking on your  ?monitor.  ?If your monitor falls off in less than 4 days, contact our Monitor department at [phone].  ?If your monitor becomes loose or falls off after 4 days call [employer] at [phone] for  ?suggestions on securing your monitor ? ? ? ?Important Information About Sugar ? ? ? ? ?  ?

## 2022-03-10 NOTE — Progress Notes (Unsigned)
[id number] zio xt from office inventory applied to patient. ?

## 2022-03-11 ENCOUNTER — Telehealth: Payer: Self-pay | Admitting: Internal Medicine

## 2022-03-11 NOTE — Telephone Encounter (Signed)
Pt returning nurses call regarding rest results. Call transferred ?

## 2022-03-11 NOTE — Telephone Encounter (Signed)
Patient spoke with Mindi Junker and discussed lab results. See documentation on TSH lab from 03/10/22. ?

## 2022-04-02 ENCOUNTER — Ambulatory Visit (HOSPITAL_COMMUNITY): Payer: Commercial Managed Care - PPO | Attending: Cardiology

## 2022-04-02 ENCOUNTER — Ambulatory Visit (INDEPENDENT_AMBULATORY_CARE_PROVIDER_SITE_OTHER): Payer: Commercial Managed Care - PPO

## 2022-04-02 DIAGNOSIS — R072 Precordial pain: Secondary | ICD-10-CM

## 2022-04-02 DIAGNOSIS — R002 Palpitations: Secondary | ICD-10-CM | POA: Diagnosis not present

## 2022-04-02 LAB — EXERCISE TOLERANCE TEST
Angina Index: 0
Duke Treadmill Score: 10
Estimated workload: 12
Exercise duration (min): 10 min
Exercise duration (sec): 12 s
MPHR: 193 {beats}/min
Peak HR: 176 {beats}/min
Percent HR: 91 %
RPE: 15
Rest HR: 73 {beats}/min
ST Depression (mm): 0 mm

## 2022-04-02 LAB — ECHOCARDIOGRAM COMPLETE
Area-P 1/2: 3.53 cm2
S' Lateral: 2.6 cm

## 2023-08-23 ENCOUNTER — Encounter: Payer: Self-pay | Admitting: Neurology

## 2023-08-23 ENCOUNTER — Telehealth: Payer: Self-pay | Admitting: Neurology

## 2023-08-23 ENCOUNTER — Ambulatory Visit: Payer: Commercial Managed Care - PPO | Admitting: Neurology

## 2023-08-23 NOTE — Telephone Encounter (Signed)
Pt cancelled appt due to having car troulble.

## 2023-10-13 ENCOUNTER — Ambulatory Visit (INDEPENDENT_AMBULATORY_CARE_PROVIDER_SITE_OTHER): Payer: Commercial Managed Care - PPO | Admitting: Neurology

## 2023-10-13 ENCOUNTER — Encounter: Payer: Self-pay | Admitting: Neurology

## 2023-10-13 VITALS — Ht 70.0 in | Wt 216.0 lb

## 2023-10-13 DIAGNOSIS — H8110 Benign paroxysmal vertigo, unspecified ear: Secondary | ICD-10-CM

## 2023-10-13 DIAGNOSIS — G43709 Chronic migraine without aura, not intractable, without status migrainosus: Secondary | ICD-10-CM

## 2023-10-13 DIAGNOSIS — R030 Elevated blood-pressure reading, without diagnosis of hypertension: Secondary | ICD-10-CM

## 2023-10-13 MED ORDER — PROPRANOLOL HCL ER 60 MG PO CP24: 60.0000 mg | ORAL_CAPSULE | Freq: Every day | ORAL | 3 refills | Status: AC

## 2023-10-13 MED ORDER — SUMATRIPTAN SUCCINATE 50 MG PO TABS: 50.0000 mg | ORAL_TABLET | ORAL | 0 refills | Status: AC | PRN

## 2023-10-13 NOTE — Patient Instructions (Signed)
Start propranolol 60 mg daily Use sumatriptan as needed for headaches Please continue to update me regarding the vertigo and headaches Continue to follow with PCP and return as needed.

## 2023-10-13 NOTE — Progress Notes (Signed)
[facility]  PATIENT: Nathan Mckee DOB: 1996-02-12  REQUESTING CLINICIAN: Marshia Ly, PA-C HISTORY FROM: Patient  REASON FOR VISIT: Headaches, Vertigo    HISTORICAL  CHIEF COMPLAINT:  Chief Complaint  Patient presents with   New Patient (Initial Visit)    Rm12, wife,  referral for Headaches and dizziness / Malen Gauze PA: headaches: greater than 15 days a month and dizziness accompanies it along w/nausea, movements make dizziness worse. Pt stated he has had 4 episodes this year where he felt like the room was spinning.     HISTORY OF PRESENT ILLNESS:  This is a 27 year old gentleman with past medical history of headaches who is presenting for management of his headache and vertigo.  Patient tells me that he has a long history of headaches for many years but worsened this past year.  He also reports a total of 4 episodes of severe vertigo that he described as room spinning sensation.  The first episode occurred in July when he got up and felt room spinning sensation, he tried to take a shower and then fell in the shower.  He went to the ED, had a head CT which was negative for any acute abnormality and he was diagnosed with vertigo.  Since then he has additional 3 additional events, lasting about 3 to 5 minutes.  These events are not associated with nausea, or vomiting and no additional falls.  In some of these vertigo event, he does have headaches and others he report no headaches.  Meclizine is not helpful. For his headaches he does take Excedrin Migraine but is not on any preventive or abortive medication. He does have on average 5 headaches day a week.     OTHER MEDICAL CONDITIONS: Chronic headaches    REVIEW OF SYSTEMS: Full 14 system review of systems performed and negative with exception of: As noted in the HPI  ALLERGIES: No Known Allergies  HOME MEDICATIONS: Outpatient Medications Prior to Visit  Medication Sig Dispense Refill    fluticasone (FLONASE) 50 MCG/ACT nasal spray Place 2 sprays into both nostrils daily.     levocetirizine (XYZAL) 5 MG tablet Take 5 mg by mouth every evening.     loratadine (CLARITIN) 10 MG tablet Take 10 mg by mouth daily.     meclizine (ANTIVERT) 25 MG tablet Take 25 mg by mouth daily.     montelukast (SINGULAIR) 10 MG tablet Take 10 mg by mouth at bedtime.     oxyCODONE-acetaminophen (PERCOCET) 5-325 MG per tablet Take 1 tablet by mouth every 6 (six) hours as needed. (Patient not taking: Reported on 03/10/2022) 15 tablet 0   No facility-administered medications prior to visit.    PAST MEDICAL HISTORY: History reviewed. No pertinent past medical history.  PAST SURGICAL HISTORY: Past Surgical History:  Procedure Laterality Date   KNEE ARTHROSCOPY WITH ANTERIOR CRUCIATE LIGAMENT (ACL) REPAIR Right    TONSILLECTOMY AND ADENOIDECTOMY      FAMILY HISTORY: Family History  Problem Relation Age of Onset   Hypertension Father    Seizures Maternal Uncle    Brain cancer Maternal Uncle    Dementia Maternal Grandmother     SOCIAL HISTORY: Social History   Socioeconomic History   Marital status: Married    Spouse name: amanda   Number of children: 0   Years of education: Not on file   Highest education level: Bachelor's degree (e.g., BA, AB, BS)  Occupational History   Not on file  Tobacco Use   Smoking  status: Never   Smokeless tobacco: Never  Vaping Use   Vaping status: Every Day   Substances: Nicotine, Flavoring  Substance and Sexual Activity   Alcohol use: Yes    Alcohol/week: 1.0 standard drink of alcohol    Types: 1 Standard drinks or equivalent per week   Drug use: No   Sexual activity: Yes    Birth control/protection: None  Other Topics Concern   Not on file  Social History Narrative   Not on file   Social Determinants of Health   Financial Resource Strain: Not on file  Food Insecurity: Not on file  Transportation Needs: Not on file  Physical Activity: Not on  file  Stress: Not on file  Social Connections: Unknown (03/10/2022)   Received from [facility]   Social Network    Social Network: Not on file  Intimate Partner Violence: Not At Risk (05/18/2023)   Received from [facility]   HITS    Over the last 12 months how often did your partner physically hurt you?: Never    Over the last 12 months how often did your partner insult you or talk down to you?: Never    Over the last 12 months how often did your partner threaten you with physical harm?: Never    Over the last 12 months how often did your partner scream or curse at you?: Never    PHYSICAL EXAM  GENERAL EXAM/CONSTITUTIONAL: Vitals:  Vitals:   10/13/23 1309  Weight: 216 lb (98 kg)  Height: 5\' 10"  (1.778 m)   Body mass index is 30.99 kg/m. Wt Readings from Last 3 Encounters:  10/13/23 216 lb (98 kg)  03/10/22 216 lb (98 kg)  09/20/20 195 lb (88.5 kg)   Patient is in no distress; well developed, nourished and groomed; neck is supple  MUSCULOSKELETAL: Gait, strength, tone, movements noted in Neurologic exam below  NEUROLOGIC: MENTAL STATUS:      No data to display         awake, alert, oriented to person, place and time recent and remote memory intact normal attention and concentration language fluent, comprehension intact, naming intact fund of knowledge appropriate  CRANIAL NERVE:  2nd - no papilledema or hemorrhages on fundoscopic exam 2nd, 3rd, 4th, 6th - pupils equal and reactive to light, visual fields full to confrontation, extraocular muscles intact, no nystagmus 5th - facial sensation symmetric 7th - facial strength symmetric 8th - hearing intact 9th - palate elevates symmetrically, uvula midline 11th - shoulder shrug symmetric 12th - tongue protrusion midline  MOTOR:  normal bulk and tone, full strength in the BUE, BLE  SENSORY:  normal and symmetric to light touch  COORDINATION:  finger-nose-finger, fine finger movements  normal  GAIT/STATION:  normal    DIAGNOSTIC DATA (LABS, IMAGING, TESTING) - I reviewed patient records, labs, notes, testing and imaging myself where available.  Lab Results  Component Value Date   WBC 9.1 09/20/2020   HGB 16.1 09/20/2020   HCT 46.3 09/20/2020   MCV 90.1 09/20/2020   PLT 287 09/20/2020      Component Value Date/Time   NA 142 09/20/2020 1514   K 3.8 09/20/2020 1514   CL 108 09/20/2020 1514   CO2 24 09/20/2020 1514   GLUCOSE 89 09/20/2020 1514   BUN 10 09/20/2020 1514   CREATININE 0.79 09/20/2020 1514   CALCIUM 9.3 09/20/2020 1514   PROT 7.3 09/20/2020 1514   ALBUMIN 4.5 09/20/2020 1514   AST 82 (H) 09/20/2020 1514  ALT 113 (H) 09/20/2020 1514   ALKPHOS 63 09/20/2020 1514   BILITOT 0.8 09/20/2020 1514   GFRNONAA >60 09/20/2020 1514   No results found for: "CHOL", "HDL", "LDLCALC", "LDLDIRECT", "TRIG", "CHOLHDL" No results found for: "HGBA1C" No results found for: "VITAMINB12" Lab Results  Component Value Date   TSH 3.310 03/10/2022    Head CT 05/18/2023 No acute intracranial hemorrhage, mass effect, or midline shift. Mild sinus disease.   ASSESSMENT AND PLAN  27 y.o. year old male with history of chronic migraines who is presenting for management of migraine and vertigo.  He reports since July he had a total of 4 episodes of vertigo that he described as room spinning sensation lasting less than 5 minutes.  Consideration include benign positional vertigo versus vestibular migraine since he does have a history of chronic migraines.  For his migraines, patient report 5 days of migraine per week.  He is not on any prescribed medication.  I will start him on propranolol 60 mg daily since he was noted to have elevated blood pressure reading today and also sumatriptan as needed for the headaches.  In the case these events are vestibular migraine, this medication had the potential to help.  Advised him to contact me for updates, otherwise continue to follow  with PCP and return as needed. He voiced understanding.    1. Chronic migraine without aura without status migrainosus, not intractable   2. Benign paroxysmal positional vertigo, unspecified laterality   3. Elevated blood pressure reading     Patient Instructions  Start propranolol 60 mg daily Use sumatriptan as needed for headaches Please continue to update me regarding the vertigo and headaches Continue to follow with PCP and return as needed.   No orders of the defined types were placed in this encounter.   Meds ordered this encounter  Medications   propranolol ER (INDERAL LA) 60 MG 24 hr capsule    Sig: Take 1 capsule (60 mg total) by mouth daily.    Dispense:  30 capsule    Refill:  3   SUMAtriptan (IMITREX) 50 MG tablet    Sig: Take 1 tablet (50 mg total) by mouth every 2 (two) hours as needed for migraine. May repeat in 2 hours if headache persists or recurs.    Dispense:  10 tablet    Refill:  0    Return if symptoms worsen or fail to improve.    Windell Norfolk, MD 10/13/2023, 10:26 PM  [facility] [address], [address] [place], [place] [address] [phone]

## 2023-10-14 ENCOUNTER — Emergency Department (HOSPITAL_BASED_OUTPATIENT_CLINIC_OR_DEPARTMENT_OTHER): Payer: Commercial Managed Care - PPO

## 2023-10-14 ENCOUNTER — Emergency Department (HOSPITAL_BASED_OUTPATIENT_CLINIC_OR_DEPARTMENT_OTHER)
Admission: EM | Admit: 2023-10-14 | Discharge: 2023-10-14 | Disposition: A | Discharge status: Home / Self Care | Payer: Commercial Managed Care - PPO | Attending: Emergency Medicine | Admitting: Emergency Medicine

## 2023-10-14 ENCOUNTER — Other Ambulatory Visit: Payer: Self-pay

## 2023-10-14 ENCOUNTER — Encounter (HOSPITAL_BASED_OUTPATIENT_CLINIC_OR_DEPARTMENT_OTHER): Payer: Self-pay

## 2023-10-14 DIAGNOSIS — R0789 Other chest pain: Secondary | ICD-10-CM | POA: Insufficient documentation

## 2023-10-14 DIAGNOSIS — M7021 Olecranon bursitis, right elbow: Secondary | ICD-10-CM

## 2023-10-14 DIAGNOSIS — M25521 Pain in right elbow: Secondary | ICD-10-CM | POA: Diagnosis not present

## 2023-10-14 DIAGNOSIS — R079 Chest pain, unspecified: Secondary | ICD-10-CM

## 2023-10-14 LAB — BASIC METABOLIC PANEL
Anion gap: 7 (ref 5–15)
BUN: 10 mg/dL (ref 6–20)
CO2: 28 mmol/L (ref 22–32)
Calcium: 9.1 mg/dL (ref 8.9–10.3)
Chloride: 103 mmol/L (ref 98–111)
Creatinine, Ser: 0.83 mg/dL (ref 0.61–1.24)
GFR, Estimated: >60 mL/min (ref >60)
Glucose, Bld: 104 mg/dL — ABNORMAL HIGH (ref 70–99)
Potassium: 3.9 mmol/L (ref 3.5–5.1)
Sodium: 138 mmol/L (ref 135–145)

## 2023-10-14 LAB — CBC
HCT: 42.6 % (ref 39.0–52.0)
Hemoglobin: 15.1 g/dL (ref 13.0–17.0)
MCH: 31.2 pg (ref 26.0–34.0)
MCHC: 35.4 g/dL (ref 30.0–36.0)
MCV: 88 fL (ref 80.0–100.0)
Platelets: 307 10*3/uL (ref 150–400)
RBC: 4.84 MIL/uL (ref 4.22–5.81)
RDW: 11.9 % (ref 11.5–15.5)
WBC: 11.4 10*3/uL — ABNORMAL HIGH (ref 4.0–10.5)
nRBC: 0 % (ref 0.0–0.2)

## 2023-10-14 LAB — TROPONIN I (HIGH SENSITIVITY): Troponin I (High Sensitivity): 5 ng/L (ref <18)

## 2023-10-14 MED ORDER — HYDROCODONE-ACETAMINOPHEN 5-325 MG PO TABS: 1.0000 | ORAL_TABLET | Freq: Four times a day (QID) | ORAL | 0 refills | Status: AC | PRN

## 2023-10-14 MED ORDER — HYDROCODONE-ACETAMINOPHEN 5-325 MG PO TABS
2.0000 | ORAL_TABLET | Freq: Once | ORAL | Status: AC
Start: 2023-10-14 — End: 2023-10-14
  Administered 2023-10-14: 2 via ORAL
  Filled 2023-10-14: qty 2

## 2023-10-14 MED ORDER — CEPHALEXIN 250 MG PO CAPS
500.0000 mg | ORAL_CAPSULE | Freq: Once | ORAL | Status: AC
  Administered 2023-10-14: 500 mg via ORAL
  Filled 2023-10-14: qty 2

## 2023-10-14 MED ORDER — PREDNISONE 10 MG PO TABS: 20.0000 mg | ORAL_TABLET | Freq: Two times a day (BID) | ORAL | 0 refills | Status: AC

## 2023-10-14 MED ORDER — PREDNISONE 20 MG PO TABS
40.0000 mg | ORAL_TABLET | Freq: Once | ORAL | Status: AC
  Administered 2023-10-14: 40 mg via ORAL
  Filled 2023-10-14: qty 2

## 2023-10-14 MED ORDER — CEPHALEXIN 500 MG PO CAPS: 500.0000 mg | ORAL_CAPSULE | Freq: Four times a day (QID) | ORAL | 0 refills | Status: AC

## 2023-10-14 NOTE — ED Provider Notes (Signed)
[facility] EMERGENCY DEPARTMENT AT [facility] Provider Note   CSN: [id number] Arrival date & time: 10/14/23  4098     History  Chief Complaint  Patient presents with   Chest Pain   Arm Pain    Nathan Mckee is a 27 y.o. male.  Patient is a 27 year old male presenting with complaints of chest discomfort.  This started yesterday evening at approximately 930.  He describes the sensation as if something pressing on his chest.  He denies any shortness of breath, nausea, diaphoresis, or radiation to the arm or jaw.  No fevers or chills.  No cough.  No aggravating or alleviating factors.  Patient denies any prior cardiac history.  He has no cardiac risk factors.  Patient also complaining of right elbow pain redness, and swelling.  This started in the night as well in the absence of any injury or trauma.  The history is provided by the patient.       Home Medications Prior to Admission medications   Medication Sig Start Date End Date Taking? Authorizing Provider  fluticasone (FLONASE) 50 MCG/ACT nasal spray Place 2 sprays into both nostrils daily. 05/18/23   [provider]  levocetirizine (XYZAL) 5 MG tablet Take 5 mg by mouth every evening.    [provider]  loratadine (CLARITIN) 10 MG tablet Take 10 mg by mouth daily.    [provider]  meclizine (ANTIVERT) 25 MG tablet Take 25 mg by mouth daily. 06/07/23   [provider]  montelukast (SINGULAIR) 10 MG tablet Take 10 mg by mouth at bedtime.    [provider]  propranolol ER (INDERAL LA) 60 MG 24 hr capsule Take 1 capsule (60 mg total) by mouth daily. 10/13/23   Windell Norfolk, MD  SUMAtriptan (IMITREX) 50 MG tablet Take 1 tablet (50 mg total) by mouth every 2 (two) hours as needed for migraine. May repeat in 2 hours if headache persists or recurs. 10/13/23   Windell Norfolk, MD      Allergies    Patient has no known allergies.    Review of Systems   Review of Systems   All other systems reviewed and are negative.   Physical Exam Updated Vital Signs BP (!) 135/102 (BP Location: Left Arm)   Pulse 71   Temp 98.3 F (36.8 C) (Oral)   Resp 18   Ht 5\' 10"  (1.778 m)   Wt 97.5 kg   SpO2 98%   BMI 30.85 kg/m  Physical Exam Vitals and nursing note reviewed.  Constitutional:      General: He is not in acute distress.    Appearance: He is well-developed. He is not diaphoretic.  HENT:     Head: Normocephalic and atraumatic.  Cardiovascular:     Rate and Rhythm: Normal rate and regular rhythm.     Heart sounds: No murmur heard.    No friction rub.  Pulmonary:     Effort: Pulmonary effort is normal. No respiratory distress.     Breath sounds: Normal breath sounds. No wheezing or rales.  Abdominal:     General: Bowel sounds are normal. There is no distension.     Palpations: Abdomen is soft.     Tenderness: There is no abdominal tenderness.  Musculoskeletal:        General: Normal range of motion.     Cervical back: Normal range of motion and neck supple.     Right lower leg: No tenderness. No edema.  Left lower leg: No tenderness. No edema.  Skin:    General: Skin is warm and dry.  Neurological:     Mental Status: He is alert and oriented to person, place, and time.     Coordination: Coordination normal.     ED Results / Procedures / Treatments   Labs (all labs ordered are listed, but only abnormal results are displayed) Labs Reviewed  BASIC METABOLIC PANEL  CBC  TROPONIN I (HIGH SENSITIVITY)    EKG EKG Interpretation Date/Time:  Thursday October 14 2023 05:16:25 EST Ventricular Rate:  69 PR Interval:  146 QRS Duration:  89 QT Interval:  384 QTC Calculation: 412 R Axis:   57  Text Interpretation: Sinus rhythm Normal ECG Confirmed by Geoffery Lyons ([id number]) on 10/14/2023 5:17:39 AM  Radiology No results found.  Procedures Procedures    Medications Ordered in ED Medications - No data to display  ED Course/ Medical  Decision Making/ A&P  Patient is a 27 year old male presenting with chest discomfort as described in the HPI.  He is also experiencing redness and swelling to the right elbow.  Patient arrives here with stable vital signs and is afebrile.  Physical examination basically unremarkable with the exception of erythema, tenderness, and warmth over the olecranon.  Workup initiated including CBC, BMP, and troponin, all of which are unremarkable.  Chest x-ray is clear.  Patient's cardiac workup is unremarkable and I highly doubt a cardiac etiology.  His symptoms are atypical.  I feel as though patient can be discharged safely.  To follow-up as needed.  He also has what appears to be olecranon bursitis.  This to be treated with antibiotics and steroids.  He will also be given a small quantity of pain medicine and see how he responds.  Patient to return as needed if symptoms worsen.  Final Clinical Impression(s) / ED Diagnoses Final diagnoses:  None    Rx / DC Orders ED Discharge Orders     None         Geoffery Lyons, MD 10/14/23 [phone]

## 2023-10-14 NOTE — ED Triage Notes (Signed)
Pt reports chest pressure radiating to back last night, took aspirin at 9pm. Pt reports waking up around 2am with a painful red knot on right elbow. Patient reports CP is about the same as before going to sleep, rates about a 5/10 dull running across chest. Pt states right elbow is a 10/10.

## 2023-10-14 NOTE — Discharge Instructions (Signed)
Begin taking prednisone and Keflex as prescribed.  Begin taking hydrocodone as prescribed as needed for pain.  Return to the emergency department if you develop worsening chest pain, difficulty breathing, high fevers, or for other new and concerning symptoms.

## 2023-10-14 NOTE — ED Notes (Signed)
X-ray at bedside
